# Patient Record
Sex: Male | Born: 1935 | Race: White | Hispanic: No | Smoking: Former smoker
Health system: Southern US, Community
[De-identification: ages and names within clinical notes are randomized; demographics above are authoritative.]

## PROBLEM LIST (undated history)

## (undated) DIAGNOSIS — I214 Non-ST elevation (NSTEMI) myocardial infarction: Secondary | ICD-10-CM

## (undated) DIAGNOSIS — I10 Essential (primary) hypertension: Secondary | ICD-10-CM

---
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
## 1988-12-23 HISTORY — PX: BACK SURGERY: SHX140

## 2019-06-02 ENCOUNTER — Emergency Department (HOSPITAL_COMMUNITY): Payer: Medicare Other

## 2019-06-02 ENCOUNTER — Other Ambulatory Visit: Payer: Self-pay

## 2019-06-02 ENCOUNTER — Encounter (HOSPITAL_COMMUNITY): Payer: Self-pay | Admitting: Emergency Medicine

## 2019-06-02 DIAGNOSIS — R0609 Other forms of dyspnea: Secondary | ICD-10-CM | POA: Insufficient documentation

## 2019-06-02 DIAGNOSIS — I493 Ventricular premature depolarization: Secondary | ICD-10-CM

## 2019-06-02 DIAGNOSIS — Z20828 Contact with and (suspected) exposure to other viral communicable diseases: Secondary | ICD-10-CM | POA: Diagnosis present

## 2019-06-02 DIAGNOSIS — I214 Non-ST elevation (NSTEMI) myocardial infarction: Secondary | ICD-10-CM | POA: Diagnosis present

## 2019-06-02 DIAGNOSIS — E785 Hyperlipidemia, unspecified: Secondary | ICD-10-CM | POA: Diagnosis present

## 2019-06-02 DIAGNOSIS — R0902 Hypoxemia: Secondary | ICD-10-CM | POA: Diagnosis not present

## 2019-06-02 DIAGNOSIS — Z7982 Long term (current) use of aspirin: Secondary | ICD-10-CM

## 2019-06-02 DIAGNOSIS — I5021 Acute systolic (congestive) heart failure: Secondary | ICD-10-CM | POA: Diagnosis present

## 2019-06-02 DIAGNOSIS — R001 Bradycardia, unspecified: Secondary | ICD-10-CM | POA: Diagnosis not present

## 2019-06-02 DIAGNOSIS — I50811 Acute right heart failure: Secondary | ICD-10-CM | POA: Diagnosis present

## 2019-06-02 DIAGNOSIS — I110 Hypertensive heart disease with heart failure: Secondary | ICD-10-CM | POA: Diagnosis present

## 2019-06-02 DIAGNOSIS — I509 Heart failure, unspecified: Secondary | ICD-10-CM

## 2019-06-02 DIAGNOSIS — I472 Ventricular tachycardia: Secondary | ICD-10-CM | POA: Diagnosis not present

## 2019-06-02 DIAGNOSIS — I469 Cardiac arrest, cause unspecified: Secondary | ICD-10-CM | POA: Diagnosis not present

## 2019-06-02 HISTORY — DX: Non-ST elevation (NSTEMI) myocardial infarction: I21.4

## 2019-06-02 HISTORY — DX: Essential (primary) hypertension: I10

## 2019-06-02 LAB — BASIC METABOLIC PANEL
Anion gap: 13 (ref 5–15)
BUN: 38 mg/dL — ABNORMAL HIGH (ref 8–23)
CO2: 18 mmol/L — ABNORMAL LOW (ref 22–32)
Calcium: 9 mg/dL (ref 8.9–10.3)
Chloride: 108 mmol/L (ref 98–111)
Creatinine, Ser: 1.48 mg/dL — ABNORMAL HIGH (ref 0.61–1.24)
GFR calc Af Amer: 51 mL/min — ABNORMAL LOW (ref >60)
GFR calc non Af Amer: 44 mL/min — ABNORMAL LOW (ref >60)
Glucose, Bld: 131 mg/dL — ABNORMAL HIGH (ref 70–99)
Potassium: 4.9 mmol/L (ref 3.5–5.1)
Sodium: 139 mmol/L (ref 135–145)

## 2019-06-02 LAB — HEPATIC FUNCTION PANEL
ALT: 612 U/L — ABNORMAL HIGH (ref 0–44)
AST: 542 U/L — ABNORMAL HIGH (ref 15–41)
Albumin: 3.3 g/dL — ABNORMAL LOW (ref 3.5–5.0)
Alkaline Phosphatase: 61 U/L (ref 38–126)
Bilirubin, Direct: 0.3 mg/dL — ABNORMAL HIGH (ref 0.0–0.2)
Indirect Bilirubin: 0.8 mg/dL (ref 0.3–0.9)
Total Bilirubin: 1.1 mg/dL (ref 0.3–1.2)
Total Protein: 6.3 g/dL — ABNORMAL LOW (ref 6.5–8.1)

## 2019-06-02 LAB — BRAIN NATRIURETIC PEPTIDE: B Natriuretic Peptide: 1459.2 pg/mL — ABNORMAL HIGH (ref 0.0–100.0)

## 2019-06-02 LAB — TROPONIN I
Troponin I: 1.24 ng/mL (ref <0.03)
Troponin I: 1.72 ng/mL (ref <0.03)
Troponin I: 1.95 ng/mL (ref <0.03)

## 2019-06-02 LAB — CBC
HCT: 37.7 % — ABNORMAL LOW (ref 39.0–52.0)
Hemoglobin: 12.2 g/dL — ABNORMAL LOW (ref 13.0–17.0)
MCH: 30 pg (ref 26.0–34.0)
MCHC: 32.4 g/dL (ref 30.0–36.0)
MCV: 92.9 fL (ref 80.0–100.0)
Platelets: 278 10*3/uL (ref 150–400)
RBC: 4.06 MIL/uL — ABNORMAL LOW (ref 4.22–5.81)
RDW: 13.2 % (ref 11.5–15.5)
WBC: 9.7 10*3/uL (ref 4.0–10.5)
nRBC: 0 % (ref 0.0–0.2)

## 2019-06-02 LAB — SARS CORONAVIRUS 2: SARS Coronavirus 2: NOT DETECTED

## 2019-06-02 LAB — D-DIMER, QUANTITATIVE: D-Dimer, Quant: 6.8 ug/mL-FEU — ABNORMAL HIGH (ref 0.00–0.50)

## 2019-06-02 LAB — HEMOGLOBIN A1C
Hgb A1c MFr Bld: 6.2 % — ABNORMAL HIGH (ref 4.8–5.6)
Mean Plasma Glucose: 131.24 mg/dL

## 2019-06-02 LAB — TSH: TSH: 1.519 u[IU]/mL (ref 0.350–4.500)

## 2019-06-02 LAB — HEPARIN LEVEL (UNFRACTIONATED): Heparin Unfractionated: 0.14 IU/mL — ABNORMAL LOW (ref 0.30–0.70)

## 2019-06-02 MED ORDER — SODIUM CHLORIDE 0.9% FLUSH: 3.0000 mL | Freq: Once | INTRAVENOUS | Status: DC

## 2019-06-02 MED ORDER — IOHEXOL 350 MG/ML SOLN
60.0000 mL | Freq: Once | INTRAVENOUS | Status: AC | PRN
  Administered 2019-06-02: 60 mL via INTRAVENOUS

## 2019-06-02 MED ORDER — HEPARIN BOLUS VIA INFUSION
4000.0000 [IU] | Freq: Once | INTRAVENOUS | Status: AC
  Administered 2019-06-02: 4000 [IU] via INTRAVENOUS
  Filled 2019-06-02: qty 4000

## 2019-06-02 MED ORDER — ASPIRIN 81 MG PO CHEW: 324.0000 mg | CHEWABLE_TABLET | ORAL | Status: DC

## 2019-06-02 MED ORDER — HEPARIN (PORCINE) 25000 UT/250ML-% IV SOLN
1000.0000 [IU]/h | INTRAVENOUS | Status: DC
  Administered 2019-06-02: 09:00:00 800 [IU]/h via INTRAVENOUS
  Administered 2019-06-03: 05:00:00 1000 [IU]/h via INTRAVENOUS
  Filled 2019-06-02 (×2): qty 250

## 2019-06-02 MED ORDER — ADULT MULTIVITAMIN W/MINERALS CH: 1.0000 | ORAL_TABLET | Freq: Every day | ORAL | Status: DC

## 2019-06-02 MED ORDER — FUROSEMIDE 10 MG/ML IJ SOLN
40.0000 mg | Freq: Two times a day (BID) | INTRAMUSCULAR | Status: DC
  Administered 2019-06-02: 40 mg via INTRAVENOUS
  Filled 2019-06-02: qty 4

## 2019-06-02 MED ORDER — NITROGLYCERIN 0.4 MG SL SUBL: 0.4000 mg | SUBLINGUAL_TABLET | SUBLINGUAL | Status: DC | PRN

## 2019-06-02 MED ORDER — ALPRAZOLAM 0.25 MG PO TABS
0.2500 mg | ORAL_TABLET | Freq: Two times a day (BID) | ORAL | Status: DC | PRN
  Administered 2019-06-02: 0.25 mg via ORAL
  Filled 2019-06-02: qty 1

## 2019-06-02 MED ORDER — CYCLOBENZAPRINE HCL 10 MG PO TABS: 10.0000 mg | ORAL_TABLET | Freq: Three times a day (TID) | ORAL | Status: DC | PRN

## 2019-06-02 MED ORDER — URINOZINC PLUS PO TABS: 2.0000 | ORAL_TABLET | Freq: Every day | ORAL | Status: DC

## 2019-06-02 MED ORDER — ATORVASTATIN CALCIUM 80 MG PO TABS
80.0000 mg | ORAL_TABLET | Freq: Every day | ORAL | Status: DC
  Administered 2019-06-02: 80 mg via ORAL
  Filled 2019-06-02: qty 1

## 2019-06-02 MED ORDER — ASPIRIN EC 81 MG PO TBEC: 81.0000 mg | DELAYED_RELEASE_TABLET | Freq: Every day | ORAL | Status: DC

## 2019-06-02 MED ORDER — CHLORPHENIRAMINE MALEATE 4 MG PO TABS: 4.0000 mg | ORAL_TABLET | Freq: Every day | ORAL | Status: DC

## 2019-06-02 MED ORDER — ACETAMINOPHEN 325 MG PO TABS: 650.0000 mg | ORAL_TABLET | ORAL | Status: DC | PRN

## 2019-06-02 MED ORDER — FUROSEMIDE 10 MG/ML IJ SOLN
40.0000 mg | Freq: Once | INTRAMUSCULAR | Status: AC
  Administered 2019-06-02: 40 mg via INTRAVENOUS
  Filled 2019-06-02: qty 4

## 2019-06-02 MED ORDER — ASPIRIN 300 MG RE SUPP: 300.0000 mg | RECTAL | Status: DC

## 2019-06-02 MED ORDER — ZOLPIDEM TARTRATE 5 MG PO TABS
5.0000 mg | ORAL_TABLET | Freq: Every evening | ORAL | Status: DC | PRN
  Administered 2019-06-02: 5 mg via ORAL
  Filled 2019-06-02: qty 1

## 2019-06-02 MED ORDER — ONDANSETRON HCL 4 MG/2ML IJ SOLN: 4.0000 mg | Freq: Four times a day (QID) | INTRAMUSCULAR | Status: DC | PRN

## 2019-06-02 NOTE — Progress Notes (Signed)
Patient has been resting in bed with no complaints since arrival to unit.

## 2019-06-02 NOTE — ED Notes (Signed)
Lunch Tray Ordered-per Jenny Reichmann, RN-called by Levada Dy

## 2019-06-02 NOTE — ED Notes (Signed)
CRITICAL VALUE ALERT  Critical Value:  Trop 1.4 Date & Time Notied:  06/07/2019  Provider Notified:[place]

## 2019-06-02 NOTE — H&P (Addendum)
Cardiology Admission History and Physical:   Patient ID: Kevin Mann; MRN: [id number]; DOB: April 20, 1936   Admission date: Nov 30, 2019  Primary Care Provider: [facility], [facility] Primary Cardiologist: No primary care provider on file. New, Dr Tresa EndoKelly [facility] Primary Electrophysiologist:  None   Chief Complaint:  CP/SOB  Patient Profile:   Kevin Mann is a 83 y.o. male with a history of HTN, (denies HLD but is on Zocor), who came to the ER for chest pain. Trop and BNP elevated, cards asked to see by Dr Judd Lienelo.  History of Present Illness:   Kevin Mann has never had evaluation by cardiologist.   About 10 days ago, he mowed the yard and felt well. The next day, he felt achy all over and tired. Does not think he had a fever. Had some aching in his chest, 2/10. Some SOB. No N&V. Did not seek medical help, sx gradually improved.  Ever since then, he had gotten extremely weak when he tries to do anything. Feels SOB with this. He will get light-headed and dizzy. He gets these sx when he walks just a few steps. Will rest and feel some better.   Has continued to have the chest aching, never > 2/10.   He describes PND and SOB w/ minimal exertion. No LE edema, may have orthopnea.   Finally came to the ER today because sx were significant and not getting any better.   Has not eaten or had am meds today.    Past Medical History:  Diagnosis Date   Hypertension    NSTEMI (non-ST elevated myocardial infarction) (HCC) Nov 30, 2019    Past Surgical History:  Procedure Laterality Date   BACK SURGERY  1990     Medications Prior to Admission: Prior to Admission medications   Medication Sig Start Date End Date Taking? Authorizing Provider  amLODipine (NORVASC) 10 MG tablet Take 10 mg by mouth daily.   Yes [provider]  aspirin EC 81 MG tablet Take 81 mg by mouth daily.   Yes [provider]  Chlorpheniramine Maleate (ALLERGY PO) Take 1  tablet by mouth daily.   Yes [provider]  cyclobenzaprine (FLEXERIL) 10 MG tablet Take 10 mg by mouth 3 (three) times daily as needed for muscle spasms.   Yes [provider]  lisinopril (ZESTRIL) 20 MG tablet Take 20 mg by mouth 2 (two) times a day.   Yes [provider]  Misc Natural Products (OSTEO BI-FLEX ADV JOINT SHIELD PO) Take 1 tablet by mouth daily.   Yes [provider]  Misc Natural Products (URINOZINC PLUS PO) Take 1 tablet by mouth daily.   Yes [provider]  Multiple Vitamin (MULTIVITAMIN WITH MINERALS) TABS tablet Take 1 tablet by mouth daily.   Yes [provider]  simvastatin (ZOCOR) 20 MG tablet Take 20 mg by mouth daily.   Yes [provider]     Allergies:   No Known Allergies  Social History:   Social History   Socioeconomic History   Marital status: Widowed    Spouse name: Not on file   Number of children: Not on file   Years of education: Not on file   Highest education level: Not on file  Occupational History   Occupation: Retired Social research officer, [employer]  Social Needs   Financial resource strain: Not on file   Food insecurity:    Worry: Not on file    Inability: Not on file  Transportation needs:    Medical: Not on file    Non-medical: Not on file  Tobacco Use   Smoking status: Former Smoker    Last attempt to quit: 05/24/2019    Years since quitting: 0.0   Smokeless tobacco: Never Used  Substance and Sexual Activity   Alcohol use: Never    Frequency: Never   Drug use: Never   Sexual activity: Not on file  Lifestyle   Physical activity:    Days per week: Not on file    Minutes per session: Not on file   Stress: Not on file  Relationships   Social connections:    Talks on phone: Not on file    Gets together: Not on file    Attends religious service: Not on file    Active member of club or organization: Not on file    Attends meetings of clubs or organizations: Not  on file    Relationship status: Not on file   Intimate partner violence:    Fear of current or ex partner: Not on file    Emotionally abused: Not on file    Physically abused: Not on file    Forced sexual activity: Not on file  Other Topics Concern   Not on file  Social History Narrative   [place]ives [place] in [place].    Son and daughter live in [place].    Family History:   The patient's family history includes Unexplained death (age of onset: 1876) in his father and mother.   The patient He indicated that his mother is deceased. He indicated that his father is deceased. He indicated that his brother is deceased.   ROS:  Please see the history of present illness.  All other ROS reviewed and negative.     Physical Exam/Data:   Vitals:   12/05/2019 0340 12/05/2019 0341 12/05/2019 0727  BP: 98/73  109/72  Pulse: 91  87  Resp: (!) 25  20  Temp: 98.3 F (36.8 C)    TempSrc: Oral    SpO2: 92%  93%  Weight:  65.8 kg   Height:  6' (1.829 m)    No intake or output data in the 24 hours ending 12/05/2019 0828 Filed Weights   12/05/2019 0341  Weight: 65.8 kg   Body mass index is 19.67 kg/m.  General:  Well nourished, well developed, elderly male in no acute distress at rest HEENT: normal Lymph: no adenopathy Neck:  JVD elevated 9-10 cm Endocrine:  No thryomegaly Vascular: No carotid bruits; 4/4 extremity pulses 2+ bilaterally   Cardiac:  normal S1, S2; RRR; no murmur, no rub or gallop  Lungs: decreased BS bases w/ some rales bilaterally, no wheezing, rhonchi  Abd: soft, nontender, no hepatomegaly  Ext: no edema Musculoskeletal:  No deformities, BUE and BLE strength normal and equal Skin: warm and dry  Neuro:  CNs 2-12 intact, no focal abnormalities noted Psych:  Normal affect    EKG:  The ECG that was done 06/10 was personally reviewed and demonstrates SR, HR 88, inc LBBB w/ QRS duration 119 ms, diffuse ST/T wave changes.  Relevant CV Studies:  none  Laboratory  Data:  Chemistry Recent Labs  Lab 12/05/2019 0350  NA 139  K 4.9  CL 108  CO2 18*  GLUCOSE 131*  BUN 38*  CREATININE 1.48*  CALCIUM 9.0  GFRNONAA 44*  GFRAA 51*  ANIONGAP 13    No results for input(s): PROT, ALBUMIN, AST, ALT, ALKPHOS, BILITOT in the  last 168 hours. Hematology Recent Labs  Lab 06/10/2019 0350  WBC 9.7  RBC 4.06*  HGB 12.2*  HCT 37.7*  MCV 92.9  MCH 30.0  MCHC 32.4  RDW 13.2  PLT 278   Cardiac Enzymes Recent Labs  Lab 05/29/2019 0350  TROPONINI 1.24*     BNP Recent Labs  Lab 06/01/2019 0357  BNP 1,459.2*    DDimer  Recent Labs  Lab 06/11/2019 0357  DDIMER 6.80*    Radiology/Studies:  Dg Chest 2 View  Result Date: 06/19/2019 CLINICAL DATA:  Chest pain extending to left neck, arm, and back for 2 weeks. Progression today. Initial encounter EXAM: CHEST - 2 VIEW COMPARISON:  None. FINDINGS: The heart is mildly enlarged. Aortic atherosclerosis is present. Small bilateral pleural effusions are present. Fluid extends into the major fissure. Mild dependent atelectasis is noted. No other significant airspace consolidation is present. The lungs are mildly hyperexpanded. Remote left clavicle fracture is present. A T12 superior endplate fracture is likely remote. Vertebral body heights are otherwise maintained. IMPRESSION: 1. Borderline cardiomegaly. 2. Small bilateral pleural effusions. 3. Mild bibasilar airspace opacities likely reflect atelectasis. 4. Superior endplate fracture of H21 is likely remote. 5. Remote left clavicle fracture. Electronically Signed   By: San Morelle M.D.   On: 06/09/2019 05:03   Ct Angio Chest Pe W And/or Wo Contrast  Result Date: 06/07/2019 CLINICAL DATA:  Shortness of breath.  Initial encounter. EXAM: CT ANGIOGRAPHY CHEST WITH CONTRAST TECHNIQUE: Multidetector CT imaging of the chest was performed using the standard protocol during bolus administration of intravenous contrast. Multiplanar CT image reconstructions and MIPs  were obtained to evaluate the vascular anatomy. CONTRAST:  72mL OMNIPAQUE IOHEXOL 350 MG/ML SOLN COMPARISON:  Two-view chest x-ray 06/07/2019 FINDINGS: Cardiovascular: Heart is enlarged. Dense coronary artery calcifications are present. Atherosclerotic changes are noted at the aortic arch and descending thoracic aorta. There is no aneurysm or focal stenosis. Pulmonary artery opacification is excellent. There is significant reflux of contrast into the SVC and hepatic veins. No focal filling defects are present to suggest pulmonary emboli. There is no significant contrast within the left ventricle. Mediastinum/Nodes: No significant mediastinal, hilar, or axillary adenopathy is present. The thoracic inlet is within normal limits. Esophagus is unremarkable. Lungs/Pleura: Moderate bilateral pleural effusions are present. Dependent airspace opacities are present bilaterally. This is most significant at the bases. No other focal nodule, mass, or airspace disease is present. There is no pneumothorax. Upper Abdomen: No focal lesions are present. Musculoskeletal: Vertebral body heights alignment are maintained. No focal lytic or blastic lesions are present. Sternum is intact. Ribs are unremarkable. Review of the MIP images confirms the above findings. IMPRESSION: 1. No pulmonary embolus. 2. Cardiomegaly with reflux of contrast into the SVC and hepatic veins suggesting right heart failure 3. Bilateral pleural effusions. 4. Dependent airspace disease likely reflects atelectasis. Increased consolidation is present at the bases. Infection is not excluded. 5.  Aortic Atherosclerosis (ICD10-I70.0). Electronically Signed   By: San Morelle M.D.   On: 05/26/2019 07:02    Assessment and Plan:   Principal Problem:   NSTEMI (non-ST elevated myocardial infarction) ([place]) Active Problems:   Acute CHF (congestive heart failure) ([place])  1. NSTEMI - Suspect it happened 10 days ago - troponin probably trending down, continue  to cycle - ck echo - once volume good, needs cath if Cr will allow  2. Acute CHF - Likely systolic, f/u on echo - see CT report, likely R heart failure - diurese and follow renal  function, I/O, daily wts - had Lasix 40 mg IV in ER - add BB if BP will permit  3. Elevated Cr - unknown if acute or chronic - hold ACE  4. HTN - BP low today - if EF low, amlodipine not the best rx>>hold this - hold ACE, restart if possible - add BB as BP allows.  Admit to inpatient.  For questions or updates, please contact [facility] Please consult [email or web address] for contact info under Cardiology/STEMI.    SignedTheodore Demark, Rhonda Barrett, PA-C  06/11/2019 8:28 AM   Patient seen and examined. Agree with assessment and plan.  Mr. Kevin Mann is an 83 year old gentleman who has a history of hypertension is been on amlodipine and lisinopril as well as a history of hyper lipidemia currently on simvastatin.  He denied any otherwise previous significant cardiac history.  He is remained active for most of his life and had continued to exercise cut his grass.  Approximately 10 days ago, he developed an episode of left-sided chest shoulder discomfort which radiated down his left arm.  Since that time, he is noticed shortness of breath with activity increased fatigue and dyspnea with even walking to get the mail from his mailbox.  Due to recurrent symptomatology, he ultimately presented to the emergency room this morning.  ECG showed sinus rhythm at 88 with T wave abnormality the inferolateral leads, and isolated PVC, and QTc interval of 472 ms.. Laboratory revealed a BUN of 38 and creatinine 1.48, BNP at 1459, troponin I 0.72, d-dimer 6.8, TSH 1.519.  X-ray revealed borderline cardiomegaly with small bilateral pleural effusions basilar atelectasis.  A chest CT did not reveal any evidence for PE.  Suggestion of heart failure with contrast reflux into the SVC and hepatic vein.  On exam presently, he is chest pain-free.   Telemetry reveals sinus rhythm in the 90s with an occasional unifocal PVC.  HEENT was notable for male pattern alopecia.  JVD measured 7-8 cm, there was decreased breath sounds at his bases.  Rhythm was regular with an occasional ectopic complex.  There was a faint 1/6 systolic murmur.  It was soft nontender without apparent splenomegaly.  Pulses were 2+.  There was no significant lower extremity edema.  Neurologic exam was grossly nonfocal.  He had normal affect and cognition.  I suspect the patient may have suffered a possible non-STEMI 10 days ago associated with subsequent post MI CHF.  Presently, he is on IV heparin and chest pain-free.  He was given Lasix 40 mg IV to initiate diuresis.  We will add low-dose carvedilol at 3.125 mg twice a day.  Plan for 2D echo Doppler study today.  I have recommended definitive cardiac catheterization.  Will reassess renal function in a.m. and tentatively plan this to be done tomorrow.The risks and benefits of a cardiac catheterization including, but not limited to, death, stroke, MI, kidney damage and bleeding were discussed with the patient who indicates understanding and agrees to proceed.   Lennette Biharihomas A. Fabricio Endsley, MD, [facility] 05/24/2019 1:05 PM

## 2019-06-02 NOTE — ED Notes (Signed)
ED TO INPATIENT HANDOFF REPORT  ED Nurse Name and Phone #: [phone]  S Name/Age/Gender Kevin Mann 83 y.o. male Room/Bed: 039C/039C  Code Status   Code Status: Full Code  Home/SNF/Other Home Patient oriented to: self Is this baseline? Yes   Triage Complete: Triage complete  Chief Complaint cp  Triage Note Pt brought to ED by EMS from home for c/o cp radiating to left arm, neck and back for the past 2 weeks getting worse today. BP 105/68, HR 92, SPO2 96% RA.  324 mg ASA and 250 mL NS bolus given by EMS pta. Pt is AO x 4 NAD noticed. Pt denies any pain on arrival asking for something to eat.   Allergies No Known Allergies  Level of Care/Admitting Diagnosis ED Disposition    ED Disposition Condition Comment   Admit  Hospital Area: [facility] [[id number]]  Level of Care: Telemetry Cardiac [[id number]]  Covid Evaluation: Screening Protocol (No Symptoms)  Diagnosis: NSTEMI (non-ST elevated myocardial infarction) [facility]) [[id number]]  Admitting Physician: Greig Castilla  Attending Physician: Shelva Majestic A [[id number]]  Estimated length of stay: past midnight tomorrow  Certification:: I certify this patient will need inpatient services for at least 2 midnights  PT Class (Do Not Modify): Inpatient [101]  PT Acc Code (Do Not Modify): Private [1]       B Medical/Surgery History Past Medical History:  Diagnosis Date  . Hypertension   . NSTEMI (non-ST elevated myocardial infarction) ([place]) 2019-06-10   Past Surgical History:  Procedure Laterality Date  . [place]     A IV Location/Drains/Wounds Patient Lines/Drains/Airways Status   Active Line/Drains/Airways    Name:   Placement date:   Placement time:   Site:   Days:   Peripheral IV 06/10/19 Left Antecubital   June 10, 2019    0423    Antecubital   less than 1   Peripheral IV 2019-06-10 Left Forearm   06-10-2019    0848    Forearm   less than 1          Intake/Output Last 24 hours No intake or output  data in the 24 hours ending 06/10/2019 1236  Labs/Imaging Results for orders placed or performed during the hospital encounter of 06-10-19 (from the past 48 hour(s))  Basic metabolic panel     Status: Abnormal   Collection Time: June 10, 2019  3:50 AM  Result Value Ref Range   Sodium 139 135 - 145 mmol/L   Potassium 4.9 3.5 - 5.1 mmol/L   Chloride 108 98 - 111 mmol/L   CO2 18 (L) 22 - 32 mmol/L   Glucose, Bld 131 (H) 70 - 99 mg/dL   BUN 38 (H) 8 - 23 mg/dL   Creatinine, Ser 1.48 (H) 0.61 - 1.24 mg/dL   Calcium 9.0 8.9 - 10.3 mg/dL   GFR calc non Af Amer 44 (L) >60 mL/min   GFR calc Af Amer 51 (L) >60 mL/min   Anion gap 13 5 - 15    Comment: Performed at [facility], [address], [place] [address]  CBC     Status: Abnormal   Collection Time: Jun 10, 2019  3:50 AM  Result Value Ref Range   WBC 9.7 4.0 - 10.5 K/uL   RBC 4.06 (L) 4.22 - 5.81 MIL/uL   Hemoglobin 12.2 (L) 13.0 - 17.0 g/dL   HCT 37.7 (L) 39.0 - 52.0 %   MCV 92.9 80.0 - 100.0 fL  MCH 30.0 26.0 - 34.0 pg   MCHC 32.4 30.0 - 36.0 g/dL   RDW 82.913.2 56.211.5 - 13.015.5 %   Platelets 278 150 - 400 K/uL   nRBC 0.0 0.0 - 0.2 %    Comment: Performed at [facility], [address] [place], [place] [address]  Troponin I - ONCE - STAT     Status: Abnormal   Collection Time: 11/24/2019  3:50 AM  Result Value Ref Range   Troponin I 1.24 (HH) <0.03 ng/mL    Comment: CRITICAL RESULT CALLED TO, READ BACK BY AND VERIFIED WITH: L.JUSTINA,RN 0543 12/02/202020 M.CAMPBELL Performed at [facility], [address] [place], [place] [address]   Brain natriuretic peptide     Status: Abnormal   Collection Time: 11/24/2019  3:57 AM  Result Value Ref Range   B Natriuretic Peptide 1,459.2 (H) 0.0 - 100.0 pg/mL    Comment: Performed at [facility], [address] [place], [place] [address]  D-dimer, quantitative     Status: Abnormal   Collection Time: 11/24/2019  3:57 AM  Result Value Ref Range   D-Dimer, Quant 6.80 (H) 0.00  - 0.50 ug/mL-FEU    Comment: (NOTE) At the manufacturer cut-off of 0.50 ug/mL FEU, this assay has been documented to exclude PE with a sensitivity and negative predictive value of 97 to 99%.  At this time, this assay has not been approved by the FDA to exclude DVT/VTE. Results should be correlated with clinical presentation. Performed at [facility], [address] [place], [place] [address]   Hemoglobin A1c     Status: Abnormal   Collection Time: 11/24/2019 11:29 AM  Result Value Ref Range   Hgb A1c MFr Bld 6.2 (H) 4.8 - 5.6 %    Comment: (NOTE) Pre diabetes:          5.7%-6.4% Diabetes:              >6.4% Glycemic control for   <7.0% adults with diabetes    Mean Plasma Glucose 131.24 mg/dL    Comment: Performed at [facility], [address] [place], [place] [address]   Dg Chest 2 View  Result Date: 10-08-19 CLINICAL DATA:  Chest pain extending to left neck, arm, and back for 2 weeks. Progression today. Initial encounter EXAM: CHEST - 2 VIEW COMPARISON:  None. FINDINGS: The heart is mildly enlarged. Aortic atherosclerosis is present. Small bilateral pleural effusions are present. Fluid extends into the major fissure. Mild dependent atelectasis is noted. No other significant airspace consolidation is present. The lungs are mildly hyperexpanded. Remote left clavicle fracture is present. A T12 superior endplate fracture is likely remote. Vertebral body heights are otherwise maintained. IMPRESSION: 1. Borderline cardiomegaly. 2. Small bilateral pleural effusions. 3. Mild bibasilar airspace opacities likely reflect atelectasis. 4. Superior endplate fracture of T12 is likely remote. 5. Remote left clavicle fracture. Electronically Signed   By: Marin Robertshristopher  Mattern M.D.   On: 12/02/202020 05:03   Ct Angio Chest Pe W And/or Wo Contrast  Result Date: 10-08-19 CLINICAL DATA:  Shortness of breath.  Initial encounter. EXAM: CT ANGIOGRAPHY CHEST WITH CONTRAST TECHNIQUE:  Multidetector CT imaging of the chest was performed using the standard protocol during bolus administration of intravenous contrast. Multiplanar CT image reconstructions and MIPs were obtained to evaluate the vascular anatomy. CONTRAST:  60mL OMNIPAQUE IOHEXOL 350 MG/ML SOLN COMPARISON:  Two-view chest x-ray 12/02/202020 FINDINGS: Cardiovascular: Heart is enlarged. Dense coronary artery calcifications are  present. Atherosclerotic changes are noted at the aortic arch and descending thoracic aorta. There is no aneurysm or focal stenosis. Pulmonary artery opacification is excellent. There is significant reflux of contrast into the SVC and hepatic veins. No focal filling defects are present to suggest pulmonary emboli. There is no significant contrast within the left ventricle. Mediastinum/Nodes: No significant mediastinal, hilar, or axillary adenopathy is present. The thoracic inlet is within normal limits. Esophagus is unremarkable. Lungs/Pleura: Moderate bilateral pleural effusions are present. Dependent airspace opacities are present bilaterally. This is most significant at the bases. No other focal nodule, mass, or airspace disease is present. There is no pneumothorax. Upper Abdomen: No focal lesions are present. Musculoskeletal: Vertebral body heights alignment are maintained. No focal lytic or blastic lesions are present. Sternum is intact. Ribs are unremarkable. Review of the MIP images confirms the above findings. IMPRESSION: 1. No pulmonary embolus. 2. Cardiomegaly with reflux of contrast into the SVC and hepatic veins suggesting right heart failure 3. Bilateral pleural effusions. 4. Dependent airspace disease likely reflects atelectasis. Increased consolidation is present at the bases. Infection is not excluded. 5.  Aortic Atherosclerosis (ICD10-I70.0). Electronically Signed   By: Marin Robertshristopher  Mattern M.D.   On: July 10, 2019 07:02    Pending Labs Unresulted Labs (From admission, onward)    Start     Ordered    06/21/2019 0500  Heparin level (unfractionated)  Daily,   R     Dec 17, 2019 0747   06/14/2019 0500  CBC  Daily,   R     Dec 17, 2019 0747   05/28/2019 0500  Lipid panel  Tomorrow morning,   R     Dec 17, 2019 1111   06/21/2019 0500  Basic metabolic panel  Daily,   R     Dec 17, 2019 1111   Dec 17, 2019 1600  Heparin level (unfractionated)  Once-Timed,   R     Dec 17, 2019 0747   Dec 17, 2019 1121  SARS Coronavirus 2 (CEPHEID - Performed in [facility] hospital lab), Hosp Order  (Asymptomatic Patients Labs)  Once,   R    Question:  Rule Out  Answer:  Yes   Dec 17, 2019 1120   Dec 17, 2019 1112  Hepatic function panel  Once,   R     Dec 17, 2019 1111   Dec 17, 2019 1112  TSH  Once,   R     Dec 17, 2019 1111   Dec 17, 2019 1112  Troponin I - Now Then Q6H  Now then every 6 hours,   STAT     Dec 17, 2019 1111   Dec 17, 2019 0707  Novel Coronavirus,NAA,(SEND-OUT TO REF LAB - TAT 24-48 hrs); Hosp Order  (Asymptomatic Patients Labs)  Once,   R    Question:  Rule Out  Answer:  Yes   Dec 17, 2019 0706   Unscheduled  Occult blood card to lab, stool  As needed,   R     Dec 17, 2019 1111          Vitals/Pain Today's Vitals   Dec 17, 2019 1145 Dec 17, 2019 1200 Dec 17, 2019 1215 Dec 17, 2019 1230  BP: (!) 86/68 105/75 100/76   Pulse:  87 91 90  Resp: 18 (!) 31 (!) 23 (!) 24  Temp:      TempSrc:      SpO2:  92% 95% 95%  Weight:      Height:      PainSc:        Isolation Precautions No active isolations  Medications Medications  sodium chloride flush (NS) 0.9 % injection 3 mL (3 mLs Intravenous Not Given Apr 20, 2019 0517)  heparin ADULT infusion  100 units/mL (25000 units/25650mL sodium chloride 0.45%) (800 Units/hr Intravenous New Bag/Given 05/26/2019 0850)  nitroGLYCERIN (NITROSTAT) SL tablet 0.4 mg (has no administration in time range)  acetaminophen (TYLENOL) tablet 650 mg (has no administration in time range)  ondansetron (ZOFRAN) injection 4 mg (has no administration in time range)  zolpidem (AMBIEN) tablet 5 mg (has no administration in time range)  aspirin chewable  tablet 324 mg (has no administration in time range)    Or  aspirin suppository 300 mg (has no administration in time range)  ALPRAZolam (XANAX) tablet 0.25 mg (has no administration in time range)  aspirin EC tablet 81 mg (81 mg Oral Not Given 06/13/2019 1221)  cyclobenzaprine (FLEXERIL) tablet 10 mg (has no administration in time range)  atorvastatin (LIPITOR) tablet 80 mg (has no administration in time range)  multivitamin with minerals tablet 1 tablet (has no administration in time range)  furosemide (LASIX) injection 40 mg (has no administration in time range)  iohexol (OMNIPAQUE) 350 MG/ML injection 60 mL (60 mLs Intravenous Contrast Given 06/09/2019 0612)  furosemide (LASIX) injection 40 mg (40 mg Intravenous Given 06/18/2019 0738)  heparin bolus via infusion 4,000 Units (4,000 Units Intravenous Bolus from Bag 06/19/2019 0851)    Mobility walks with device Low fall risk   Focused Assessments Cardiac Assessment Handoff:    Lab Results  Component Value Date   TROPONINI 1.24 (HH) 05/27/2019   Lab Results  Component Value Date   DDIMER 6.80 (H) 06/10/2019   Does the Patient currently have chest pain? Yes     R Recommendations: See Admitting Provider Note  Report given to:   Additional Notes: pt is [employer]  Cindy [phone]

## 2019-06-02 NOTE — ED Triage Notes (Signed)
Pt brought to ED by EMS from home for c/o cp radiating to left arm, neck and back for the past 2 weeks getting worse today. BP 105/68, HR 92, SPO2 96% RA.  324 mg ASA and 250 mL NS bolus given by EMS pta. Pt is AO x 4 NAD noticed. Pt denies any pain on arrival asking for something to eat.

## 2019-06-02 NOTE — ED Provider Notes (Signed)
[facility] EMERGENCY DEPARTMENT Provider Note   CSN: [id number] Arrival date & time: 06/02/2019  [id number]    History   Chief Complaint Chief Complaint  Patient presents with  . Chest Pain    HPI Kevin Mann is a 83 y.o. male.     Patient is an 83 year old male with past medical history of hypertension.  He presents today with complaints of chest discomfort.  He describes the sudden onset of pain to his left upper shoulder 2 weeks ago.  This pain is since subsided, however continues to have shortness of breath that is worse with exertion.  Patient states that he walks approximately 10 steps before he develops dyspnea and has to rest.  Patient has never experienced anything like this before.  He denies any fevers or chills.  He does describe intermittent pain to the left of his sternum that comes and goes.  The history is provided by the patient.  Chest Pain  Pain location:  L chest Pain quality: stabbing   Pain radiates to:  Does not radiate Pain severity:  Mild Onset quality:  Sudden Duration:  2 weeks Timing:  Constant Progression:  Worsening Chronicity:  New Relieved by:  Nothing Worsened by:  Nothing Ineffective treatments:  None tried Associated symptoms: shortness of breath   Associated symptoms: no fever     Past Medical History:  Diagnosis Date  . Hypertension     There are no active problems to display for this patient.   History reviewed. No pertinent surgical history.      Home Medications    Prior to Admission medications   Not on File    Family History History reviewed. No pertinent family history.  Social History Social History   Tobacco Use  . Smoking status: Never Smoker  . Smokeless tobacco: Never Used  Substance Use Topics  . Alcohol use: Never    Frequency: Never  . Drug use: Never     Allergies   Patient has no known allergies.   Review of Systems Review of Systems  Constitutional: Negative for fever.   Respiratory: Positive for shortness of breath.   Cardiovascular: Positive for chest pain.  All other systems reviewed and are negative.    Physical Exam Updated Vital Signs BP 98/73 (BP Location: Right Arm)   Pulse 91   Temp 98.3 F (36.8 C) (Oral)   Resp (!) 25   Ht 6' (1.829 m)   Wt 65.8 kg   SpO2 92%   BMI 19.67 kg/m   Physical Exam Vitals signs and nursing note reviewed.  Constitutional:      General: He is not in acute distress.    Appearance: He is well-developed. He is not diaphoretic.  HENT:     Head: Normocephalic and atraumatic.  Neck:     Musculoskeletal: Normal range of motion and neck supple.  Cardiovascular:     Rate and Rhythm: Normal rate and regular rhythm.     Heart sounds: No murmur. No friction rub.  Pulmonary:     Effort: Pulmonary effort is normal. No respiratory distress.     Breath sounds: Normal breath sounds. No wheezing or rales.  Abdominal:     General: Bowel sounds are normal. There is no distension.     Palpations: Abdomen is soft.     Tenderness: There is no abdominal tenderness.  Musculoskeletal: Normal range of motion.     Right lower leg: He exhibits no tenderness. No edema.  Left lower leg: He exhibits no tenderness. No edema.  Skin:    General: Skin is warm and dry.  Neurological:     Mental Status: He is alert and oriented to person, place, and time.     Coordination: Coordination normal.      ED Treatments / Results  Labs (all labs ordered are listed, but only abnormal results are displayed) Labs Reviewed  BASIC METABOLIC PANEL  CBC  TROPONIN I  BRAIN NATRIURETIC PEPTIDE  D-DIMER, QUANTITATIVE (NOT AT [facility])    EKG None  Radiology No results found.  Procedures Procedures (including critical care time)  Medications Ordered in ED Medications  sodium chloride flush (NS) 0.9 % injection 3 mL (has no administration in time range)     Initial Impression / Assessment and Plan / ED Course  I have reviewed  the triage vital signs and the nursing notes.  Pertinent labs & imaging results that were available during my care of the patient were reviewed by me and considered in my medical decision making (see chart for details).  Patient's laboratory studies show an elevated troponin, elevated BNP, and CT scan shows findings of congestive heart failure on his x-ray.  I suspect the shoulder pain the patient experienced 2 weeks ago was likely an acute coronary event that has left him with decreased heart function and congestive heart failure.  Case was discussed with Dr. Claiborne Billings from cardiology who will evaluate and admit.  Patient will be started on heparin.  IV Lasix also administered.  CRITICAL CARE Performed by: Veryl Speak Total critical care time: 35 minutes Critical care time was exclusive of separately billable procedures and treating other patients. Critical care was necessary to treat or prevent imminent or life-threatening deterioration. Critical care was time spent personally by me on the following activities: development of treatment plan with patient and/or surrogate as well as nursing, discussions with consultants, evaluation of patient's response to treatment, examination of patient, obtaining history from patient or surrogate, ordering and performing treatments and interventions, ordering and review of laboratory studies, ordering and review of radiographic studies, pulse oximetry and re-evaluation of patient's condition.   Final Clinical Impressions(s) / ED Diagnoses   Final diagnoses:  None    ED Discharge Orders    None       Veryl Speak, MD 06/29/19 0020

## 2019-06-02 NOTE — ED Provider Notes (Signed)
I received this patient in signout from Dr. Stark Jock.  Briefly, he had presented with 2 weeks of intermittent left-sided chest pain radiating to the arm and worsening shortness of breath with exertion.  He was worked up for a PE and at time of signout, CT results showed no evidence of PE but likely right heart failure.  Labs notable for elevated BNP and troponin of 1.4.  Concern for MI with new heart failure.  Consulted cardiology and pt evaluated by Rosaria Ferries in ED. Heparin drip ordered. Cardiology will admit for further w/u and treatment.  CRITICAL CARE Performed by: Wenda Overland Little   Total critical care time: 30 minutes  Critical care time was exclusive of separately billable procedures and treating other patients.  Critical care was necessary to treat or prevent imminent or life-threatening deterioration.  Critical care was time spent personally by me on the following activities: development of treatment plan with patient and/or surrogate as well as nursing, discussions with consultants, evaluation of patient's response to treatment, examination of patient, obtaining history from patient or surrogate, ordering and performing treatments and interventions, ordering and review of laboratory studies, ordering and review of radiographic studies, pulse oximetry and re-evaluation of patient's condition.    Little, Wenda Overland, MD 2019/06/18 [phone]

## 2019-06-02 NOTE — Progress Notes (Signed)
This nurse notified of 20 beat run of Vtach by central monitor. Pt asleep. Also had a 9 beat run of Vtach around shift hange while ambulating in room and was asymptomatic. Paged cardiology to notify of this and upward trend of cardiac enzymes.

## 2019-06-02 NOTE — ED Notes (Signed)
Pt back from CT

## 2019-06-02 NOTE — Progress Notes (Signed)
ANTICOAGULATION CONSULT NOTE - Initial Consult  Pharmacy Consult for heparin Indication: chest pain/ACS  No Known Allergies  Patient Measurements: Height: 6' (182.9 cm) Weight: 145 lb (65.8 kg) IBW/kg (Calculated) : 77.6 Heparin Dosing Weight: 65.8kg  Vital Signs: Temp: 98.3 F (36.8 C) (06/10 0340) Temp Source: Oral (06/10 0340) BP: 109/72 (06/10 0727) Pulse Rate: 87 (06/10 0727)  Labs: Recent Labs    2019/06/15 0350  HGB 12.2*  HCT 37.7*  PLT 278  CREATININE 1.48*  TROPONINI 1.24*    Estimated Creatinine Clearance: 37 mL/min (A) (by C-G formula based on SCr of 1.48 mg/dL (H)).   Medical History: Past Medical History:  Diagnosis Date  . Hypertension     Medications:  Infusions:  . heparin      Assessment: 6 yom presented to the ED with CP. Troponin elevated. To start IV heparin. Baseline Hgb is slightly low but platelets are WNL. CT of the chest is reported as negative for PE. He is not on anticoagulation PTA.   Goal of Therapy:  Heparin level 0.3-0.7 units/ml Monitor platelets by anticoagulation protocol: Yes   Plan:  Heparin bolus 4000 units IV x 1 Heparin gtt 800 units/hr Check an 8 hr heparin level Daily heparin level and CBC  Kevin Mann, Kevin Mann June 15, 2019,7:47 AM

## 2019-06-02 NOTE — Progress Notes (Signed)
This RN spoke with patient's daughter, Elise Benne [phone]) who lives in [place], at the patient's bedside. She would like to be updated tomorrow in regards to the patient's plan of care. Patient tentatively planned for heart cath tomorrow. Password established by the patient is "daddy". Will continue to monitor.

## 2019-06-02 NOTE — Plan of Care (Signed)
  Problem: Education: Goal: Knowledge of General Education information will improve Description Including pain rating scale, medication(s)/side effects and non-pharmacologic comfort measures Outcome: Progressing   Problem: Health Behavior/Discharge Planning: Goal: Ability to manage health-related needs will improve Outcome: Progressing   Problem: Clinical Measurements: Goal: Ability to maintain clinical measurements within normal limits will improve Outcome: Progressing Goal: Will remain free from infection Outcome: Progressing Goal: Diagnostic test results will improve Outcome: Progressing Goal: Cardiovascular complication will be avoided Outcome: Progressing   Problem: Activity: Goal: Risk for activity intolerance will decrease Outcome: Progressing   Problem: Nutrition: Goal: Adequate nutrition will be maintained Outcome: Progressing   Problem: Elimination: Goal: Will not experience complications related to bowel motility Outcome: Progressing Goal: Will not experience complications related to urinary retention Outcome: Progressing   Problem: Pain Managment: Goal: General experience of comfort will improve Outcome: Progressing   Problem: Safety: Goal: Ability to remain free from injury will improve Outcome: Progressing   Problem: Skin Integrity: Goal: Risk for impaired skin integrity will decrease Outcome: Progressing   Problem: Education: Goal: Ability to demonstrate management of disease process will improve Outcome: Progressing Goal: Ability to verbalize understanding of medication therapies will improve Outcome: Progressing Goal: Individualized Educational Video(s) Outcome: Progressing   Problem: Activity: Goal: Capacity to carry out activities will improve Outcome: Progressing   Problem: Cardiac: Goal: Ability to achieve and maintain adequate cardiopulmonary perfusion will improve Outcome: Progressing   Problem: Education: Goal: Understanding of  cardiac disease, CV risk reduction, and recovery process will improve Outcome: Progressing Goal: Individualized Educational Video(s) Outcome: Progressing   Problem: Activity: Goal: Ability to tolerate increased activity will improve Outcome: Progressing   Problem: Cardiac: Goal: Ability to achieve and maintain adequate cardiovascular perfusion will improve Outcome: Progressing   Problem: Health Behavior/Discharge Planning: Goal: Ability to safely manage health-related needs after discharge will improve Outcome: Progressing

## 2019-06-02 NOTE — Progress Notes (Signed)
Spoke with on call cardiologist. Will monitor for now due to asymptomatic and BP in 78'E systolic.

## 2019-06-02 NOTE — Progress Notes (Signed)
[place] for heparin Indication: chest pain/ACS  No Known Allergies  Patient Measurements: Height: 6' (182.9 cm) Weight: 176 lb 6.4 oz (80 kg) IBW/kg (Calculated) : 77.6 Heparin Dosing Weight: 65.8kg  Vital Signs: Temp: 97.5 F (36.4 C) (06/10 1415) Temp Source: Oral (06/10 1415) BP: 95/69 (06/10 1337) Pulse Rate: 91 (06/10 1337)  Labs: Recent Labs    June 29, 2019 0350 June 29, 2019 1129 06-29-19 1701  HGB 12.2*  --   --   HCT 37.7*  --   --   PLT 278  --   --   HEPARINUNFRC  --   --  0.14*  CREATININE 1.48*  --   --   TROPONINI 1.24* 1.72* 1.95*    Estimated Creatinine Clearance: 43.7 mL/min (A) (by C-G formula based on SCr of 1.48 mg/dL (H)).   Medical History: Past Medical History:  Diagnosis Date  . Hypertension   . NSTEMI (non-ST elevated myocardial infarction) ([place]) 06-29-19    Medications:  Infusions:  . heparin 800 Units/hr (29-Jun-2019 1800)    Assessment: 7 yom presented to the ED with CP. Troponin elevated. To start IV heparin. Baseline Hgb is slightly low but platelets are WNL. CT of the chest is reported as negative for PE. He is not on anticoagulation PTA.   Initial heparin level this evening below goal.  No overt bleeding or complications noted.  No known issues with IV infusion.  Goal of Therapy:  Heparin level 0.3-0.7 units/ml Monitor platelets by anticoagulation protocol: Yes   Plan:  Increase IV heparin to 1000 units/hr. Recheck heparin level in 8 hrs Daily heparin level and CBC.  Marguerite Olea, [facility] Clinical Pharmacist Phone [phone]  29-Jun-2019 6:44 PM

## 2019-06-02 NOTE — ED Notes (Signed)
Spoke to pts daughter regina and she will bring pts phone and charger, she is aware that pt is to be admittedand that we are going to keep him for a few days

## 2019-06-02 NOTE — ED Notes (Signed)
Help patient use the urinal  

## 2019-06-03 ENCOUNTER — Inpatient Hospital Stay (HOSPITAL_COMMUNITY): Payer: Medicare Other | Admitting: Certified Registered Nurse Anesthetist

## 2019-06-03 ENCOUNTER — Inpatient Hospital Stay (HOSPITAL_COMMUNITY): Payer: Medicare Other

## 2019-06-03 LAB — TROPONIN I: Troponin I: 1.98 ng/mL (ref <0.03)

## 2019-06-03 LAB — GLUCOSE, CAPILLARY: Glucose-Capillary: 131 mg/dL — ABNORMAL HIGH (ref 70–99)

## 2019-06-03 LAB — NOVEL CORONAVIRUS, NAA (HOSP ORDER, SEND-OUT TO REF LAB; TAT 18-24 HRS): SARS-CoV-2, NAA: NOT DETECTED

## 2019-06-03 MED ORDER — MORPHINE SULFATE (PF) 2 MG/ML IV SOLN
INTRAVENOUS | Status: AC
  Filled 2019-06-03: qty 1

## 2019-06-03 MED FILL — Medication: Qty: 1 | Status: AC

## 2019-06-23 NOTE — Progress Notes (Signed)
Chaplain responded to a Code Blue for this patient.  Medical team called the code and the patient died.  Chaplain obtained the patient's daughter's number and the physician made the call.  The daughter Kevin Mann does want to see her father.  She is 2 hours away and should arrive around 9:30-9:45.  Unit director unsure if patient can stay in the room or go to the morgue.  Daughter is to go to the [employer] and have [facility] called.  Chaplain will pass on to the next Chaplain to meet the daughter as the daughter had questions about how to plan a service and what to do next.  Chaplain available for any further assistance. [place], MDiv.    05/27/2019 [phone]  Clinical Encounter Type  Visited With Health care provider  Visit Type Code  Referral From Nurse  Consult/Referral To Chaplain

## 2019-06-23 NOTE — Progress Notes (Signed)
Arrived at room to find pt had removed gown, both IV's and tele monitor. Blood noted on sheets but no active bleeding. Pt confused to place. Ambien had been given for sleep during night and pt was unsure if he had taken this before. Linens and gown changed. Tele monitor applied. See LDA.

## 2019-06-23 NOTE — Progress Notes (Signed)
Patient's belongings were given to family at the [facility]. Patient's wallet is in his jean pant's pocket. Patient is being transferred to the morgue currently.

## 2019-06-23 NOTE — Code Documentation (Signed)
  Patient Name: Kevin Mann   MRN: [id number]   Date of Birth/ Sex: 03-Nov-1936 , male      Admission Date: Jun 22, 2019  Attending Provider: Troy Sine, MD  Primary Diagnosis: cp   Indication: Pt was in his usual state of health until this AM, when he was noted to be PEA arrest. Code blue was subsequently called. At the time of arrival on scene, ACLS protocol was underway.  Initially became hypoxic then bradycardic before going into PEA arrest. Multiple rounds of ACLS performed before patient went into monomorphic v-tach. Shock x 1. Went back into PEA arrest. After ~30 minutes of CPR patient was pronounce dead.    Technical Description:  - CPR performance duration:  25 minutes  - Was defibrillation or cardioversion used? Yes   - Was external pacer placed? No  - Was patient intubated pre/post CPR? Yes   Medications Administered: Y = Yes; Blank = No Amiodarone    Atropine    Calcium    Epinephrine  Y  Lidocaine    Magnesium    Norepinephrine    Phenylephrine    Sodium bicarbonate  Y  Vasopressin    Other    Post CPR evaluation:  - Final Status - Was patient successfully resuscitated ? No   Miscellaneous Information:  - Time of death:  07:28 AM AM  - Primary team notified?  Yes  - Family Notified? Yes     Ina Homes, MD   06/07/2019, 7:39 AM

## 2019-06-23 NOTE — Anesthesia Procedure Notes (Signed)
Procedure Name: Intubation Date/Time: 06/14/2019 7:10 AM Performed by: Julieta Bellini, CRNA Pre-anesthesia Checklist: Patient identified, Emergency Drugs available, Suction available and Patient being monitored Patient Re-evaluated:Patient Re-evaluated prior to induction Preoxygenation: Pre-oxygenation with 100% oxygen Ventilation: Mask ventilation without difficulty Laryngoscope Size: Glidescope and 3 Grade View: Grade I Tube type: Oral Tube size: 7.5 mm Number of attempts: 1 Airway Equipment and Method: Video-laryngoscopy and Rigid stylet Placement Confirmation: ETT inserted through vocal cords under direct vision,  positive ETCO2,  breath sounds checked- equal and bilateral and CO2 detector Secured at: 22 cm Tube secured with: Tape Dental Injury: Teeth and Oropharynx as per pre-operative assessment

## 2019-06-23 NOTE — Progress Notes (Signed)
This nurse responded to call for help to pt's room at approx 0655. NT stated resident was sitting on side of bed and became unresponsive. Staff assisted resident into bed. Agonal breathing noted with faint pulse. Attempted sternal rub with minimal response. Pulse lost and code called at 0700. Pt was coded with multiple rounds of epi, shock x1 and intubation without success. Code called at 0732. This nurse notified attending MD and organ donation. ER MD notified family. OK to release body given. Daughter will not arrive for approx 2 hrs. Will notify oncoming shift.

## 2019-06-23 DEATH — deceased

## 2019-08-02 DIAGNOSIS — I469 Cardiac arrest, cause unspecified: Secondary | ICD-10-CM | POA: Diagnosis not present

## 2019-08-24 NOTE — Death Summary Note (Signed)
Expiration Note  Kevin Mann MRN: [id number] DOB: 01/27/1936  Admit Date: June 28, 2019 Time & Date of Death:   Attending Physician: Dr. Claiborne Billings PCP:  [facility] in [place] Consults: none  Cause of Death: Non ST elevation MI  Secondary Diagnosis: Acute CHF and PEA arrest Principal Problem:   NSTEMI (non-ST elevated myocardial infarction) ([place]) Active Problems:   Acute CHF (congestive heart failure) ([place])   Cardiac arrest ([place])   Procedures: NONE  Host[facility] Course: 82 YOM admitted 2019-06-28 with no prior cardiac eval.  Presented to ER with weakness for about 10 days after mowing grass.  He had dizziness and lightheadedness.  This occurs with walking just a few steps.  He also complained of chest aching about 2/10 or less.  Pos. PND and SOB with minimal exertion.  No edema.  Prior hx of HTN.   EKG with LBBB and SR with diffuse ST/T wave changes.  CT of chest done for elevated ddimer,  without PE, + cardiomegaly with reflux in SVC and hepatic veins suggesting Rt heart failure.  Bil. Pl effusions. + aortic atherosclerosis and possible infection at lung bases.   Pt admitted with probable NSTEMI 10 days prior, troponin mildly elevated.    COVID neg. BNP 1459.  Plan to do cardiac cath and check cath.  For HF diuresis.    During the night he was confused. He had rec'd Ambien.    He developed PEA arrest and Code blue was called.  ACLS protocol was followed,  initially he became hypoxic then bradycardic and then PEA arrest.  Then into monomorphic V-tach after multiple rounds of ACLS performed.  Shocked X 1 after another 30 min of CPR without response, pt was pronounced dead at 0738.     [name], NP, M.S. [facility] [address] [place], [place]  [address]  [phone]  08/02/2019 4:02 PM

## 2020-07-03 IMAGING — CT CT ANGIOGRAPHY CHEST
1 of 6 series · 4 of 16 positions shown · IV contrast (omnipaque)
Comparison: Two-view chest x-ray 06/02/2019

CLINICAL DATA: Shortness of breath.  Initial encounter.

EXAM:
CT ANGIOGRAPHY CHEST WITH CONTRAST
TECHNIQUE: Multidetector CT imaging of the chest was performed using the
standard protocol during bolus administration of intravenous
contrast. Multiplanar CT image reconstructions and MIPs were
obtained to evaluate the vascular anatomy.
CONTRAST:  60mL OMNIPAQUE IOHEXOL 350 MG/ML SOLN

[Series 7: pe thins · axial · 0.76mm/px · z∈[+1109,+1306]mm · 4 of 470 slices shown]
[im 94/470  lung]
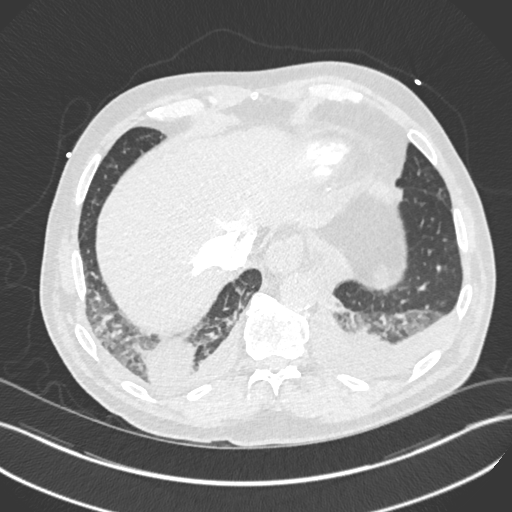
[im 188/470  soft-tissue]
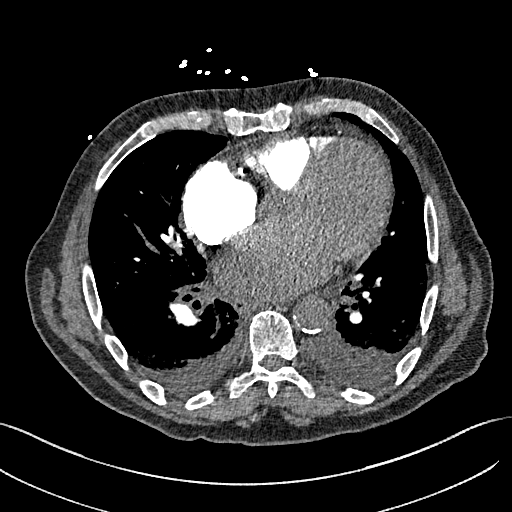
[im 282/470  lung]
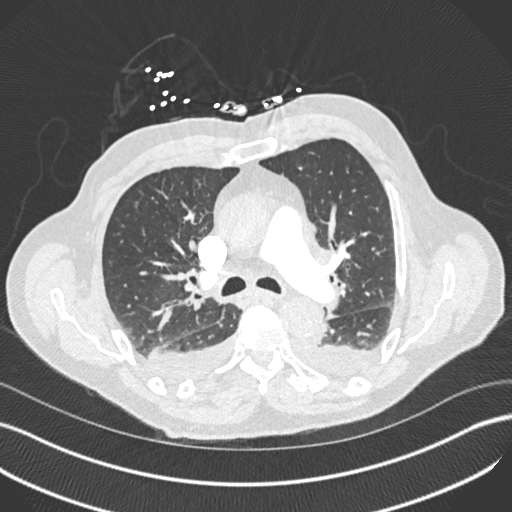
[im 376/470  soft-tissue]
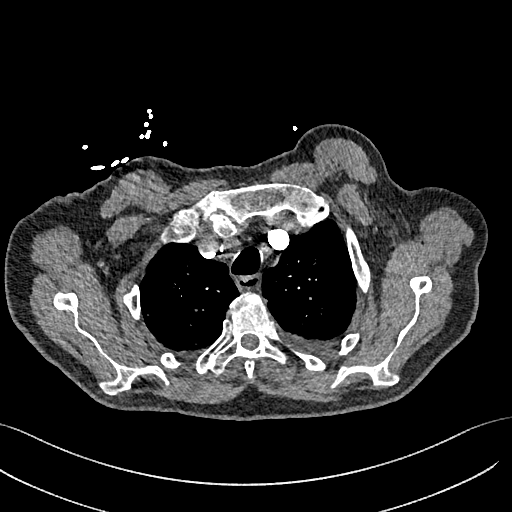

[4 of 16 positions shown; findings below may reference images not displayed]

FINDINGS: Cardiovascular: Heart is enlarged. Dense coronary artery
calcifications are present. Atherosclerotic changes are noted at the
aortic arch and descending thoracic aorta. There is no aneurysm or
focal stenosis.

Pulmonary artery opacification is excellent. There is significant
reflux of contrast into the SVC and hepatic veins. No focal filling
defects are present to suggest pulmonary emboli. There is no
significant contrast within the left ventricle.

Mediastinum/Nodes: No significant mediastinal, hilar, or axillary
adenopathy is present. The thoracic inlet is within normal limits.
Esophagus is unremarkable.

Lungs/Pleura: Moderate bilateral pleural effusions are present.
Dependent airspace opacities are present bilaterally. This is most
significant at the bases. No other focal nodule, mass, or airspace
disease is present. There is no pneumothorax.

Upper Abdomen: No focal lesions are present.

Musculoskeletal: Vertebral body heights alignment are maintained. No
focal lytic or blastic lesions are present. Sternum is intact. Ribs
are unremarkable.

Review of the MIP images confirms the above findings.
IMPRESSION: 1. No pulmonary embolus.
2. Cardiomegaly with reflux of contrast into the SVC and hepatic
veins suggesting right heart failure
3. Bilateral pleural effusions.
4. Dependent airspace disease likely reflects atelectasis. Increased
consolidation is present at the bases. Infection is not excluded.
5.  Aortic Atherosclerosis ([id number].O).
